# Patient Record
Sex: Male | Born: 1995 | Race: Black or African American | Hispanic: No | Marital: Single | Smoking: Never smoker
Health system: Southern US, Community
[De-identification: ages and names within clinical notes are randomized; demographics above are authoritative.]

## PROBLEM LIST (undated history)

## (undated) HISTORY — PX: HERNIA REPAIR: SHX51

---
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
## 2015-08-07 ENCOUNTER — Emergency Department (HOSPITAL_BASED_OUTPATIENT_CLINIC_OR_DEPARTMENT_OTHER)
Admission: EM | Admit: 2015-08-07 | Discharge: 2015-08-07 | Disposition: A | Discharge status: Home / Self Care | Payer: No Typology Code available for payment source | Attending: Emergency Medicine | Admitting: Emergency Medicine

## 2015-08-07 ENCOUNTER — Encounter (HOSPITAL_BASED_OUTPATIENT_CLINIC_OR_DEPARTMENT_OTHER): Payer: Self-pay | Admitting: Emergency Medicine

## 2015-08-07 DIAGNOSIS — Z7951 Long term (current) use of inhaled steroids: Secondary | ICD-10-CM | POA: Diagnosis not present

## 2015-08-07 DIAGNOSIS — Z791 Long term (current) use of non-steroidal anti-inflammatories (NSAID): Secondary | ICD-10-CM | POA: Diagnosis not present

## 2015-08-07 DIAGNOSIS — Y9389 Activity, other specified: Secondary | ICD-10-CM | POA: Diagnosis not present

## 2015-08-07 DIAGNOSIS — S60512A Abrasion of left hand, initial encounter: Secondary | ICD-10-CM | POA: Diagnosis not present

## 2015-08-07 DIAGNOSIS — R011 Cardiac murmur, unspecified: Secondary | ICD-10-CM | POA: Diagnosis not present

## 2015-08-07 DIAGNOSIS — T148 Other injury of unspecified body region: Secondary | ICD-10-CM | POA: Insufficient documentation

## 2015-08-07 DIAGNOSIS — Y998 Other external cause status: Secondary | ICD-10-CM | POA: Diagnosis not present

## 2015-08-07 DIAGNOSIS — Z79899 Other long term (current) drug therapy: Secondary | ICD-10-CM | POA: Insufficient documentation

## 2015-08-07 DIAGNOSIS — Y92410 Unspecified street and highway as the place of occurrence of the external cause: Secondary | ICD-10-CM | POA: Insufficient documentation

## 2015-08-07 DIAGNOSIS — S0990XA Unspecified injury of head, initial encounter: Secondary | ICD-10-CM | POA: Diagnosis present

## 2015-08-07 DIAGNOSIS — T07XXXA Unspecified multiple injuries, initial encounter: Secondary | ICD-10-CM

## 2015-08-07 DIAGNOSIS — S0081XA Abrasion of other part of head, initial encounter: Secondary | ICD-10-CM | POA: Diagnosis not present

## 2015-08-07 MED ORDER — LIDOCAINE HCL (PF) 1 % IJ SOLN
10.0000 mL | Freq: Once | INTRAMUSCULAR | Status: DC
  Filled 2015-08-07: qty 10
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
